# Patient Record
Sex: Female | Born: 1990 | Race: White | Hispanic: No | Marital: Single | Smoking: Never smoker
Health system: Southern US, Community
[De-identification: ages and names within clinical notes are randomized; demographics above are authoritative.]

---
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
## 2012-01-12 HISTORY — PX: KNEE SURGERY: SHX244

## 2012-11-12 ENCOUNTER — Emergency Department (HOSPITAL_BASED_OUTPATIENT_CLINIC_OR_DEPARTMENT_OTHER): Payer: BC Managed Care – PPO

## 2012-11-12 ENCOUNTER — Encounter (HOSPITAL_COMMUNITY): Payer: Self-pay | Admitting: Emergency Medicine

## 2012-11-12 ENCOUNTER — Emergency Department (HOSPITAL_BASED_OUTPATIENT_CLINIC_OR_DEPARTMENT_OTHER)
Admission: EM | Admit: 2012-11-12 | Discharge: 2012-11-12 | Disposition: A | Discharge status: Home / Self Care | Payer: BC Managed Care – PPO | Attending: Emergency Medicine | Admitting: Emergency Medicine

## 2012-11-12 ENCOUNTER — Emergency Department (HOSPITAL_COMMUNITY)
Admission: EM | Admit: 2012-11-12 | Discharge: 2012-11-12 | Disposition: A | Discharge status: Home / Self Care | Payer: BC Managed Care – PPO | Attending: Emergency Medicine | Admitting: Emergency Medicine

## 2012-11-12 DIAGNOSIS — S83419A Sprain of medial collateral ligament of unspecified knee, initial encounter: Secondary | ICD-10-CM | POA: Insufficient documentation

## 2012-11-12 DIAGNOSIS — Y9389 Activity, other specified: Secondary | ICD-10-CM | POA: Insufficient documentation

## 2012-11-12 DIAGNOSIS — R296 Repeated falls: Secondary | ICD-10-CM | POA: Insufficient documentation

## 2012-11-12 DIAGNOSIS — Y9289 Other specified places as the place of occurrence of the external cause: Secondary | ICD-10-CM | POA: Insufficient documentation

## 2012-11-12 DIAGNOSIS — M2392 Unspecified internal derangement of left knee: Secondary | ICD-10-CM

## 2012-11-12 DIAGNOSIS — M25469 Effusion, unspecified knee: Secondary | ICD-10-CM | POA: Insufficient documentation

## 2012-11-12 DIAGNOSIS — M25462 Effusion, left knee: Secondary | ICD-10-CM

## 2012-11-12 DIAGNOSIS — M2390 Unspecified internal derangement of unspecified knee: Secondary | ICD-10-CM | POA: Insufficient documentation

## 2012-11-12 DIAGNOSIS — X500XXA Overexertion from strenuous movement or load, initial encounter: Secondary | ICD-10-CM | POA: Insufficient documentation

## 2012-11-12 DIAGNOSIS — S83412A Sprain of medial collateral ligament of left knee, initial encounter: Secondary | ICD-10-CM

## 2012-11-12 MED ORDER — HYDROCODONE-ACETAMINOPHEN 5-325 MG PO TABS: 1.0000 | ORAL_TABLET | Freq: Four times a day (QID) | ORAL | Status: AC | PRN

## 2012-11-12 NOTE — ED Notes (Signed)
Pt not evaluated by this RN prior to discharge. Mother did not want to wait for discharge instructions

## 2012-11-12 NOTE — ED Provider Notes (Signed)
See downtime documentation.  Nursing notes and vitals signs, including pulse oximetry, reviewed.  Summary of this visit's results, reviewed by myself:  Labs:  No results found for this or any previous visit (from the past 24 hour(s)).  Imaging Studies: Dg Knee Complete 4 Views Left  11/12/2012   CLINICAL DATA:  Pain, twist injury  EXAM: LEFT KNEE - COMPLETE 4+ VIEW  COMPARISON:  None  FINDINGS: Medial compartment joint space narrowing.  Osseous mineralization normal.  No acute fracture, dislocation or bone destruction.  Small knee joint effusion is likely present.  IMPRESSION: Joint space narrowing at medial compartment left knee with suspect small knee joint effusion.   Electronically Signed   By: Ulyses Southward M.D.   On: 11/12/2012 01:47      Hanley Seamen, MD 11/12/12 0150

## 2012-11-12 NOTE — ED Provider Notes (Signed)
CSN: [id number]     Arrival date & time 11/12/12  1447 History  This chart was scribed for non-physician practitioner, Teressa Lower, NP working with Purvis Sheffield, MD by Greggory Stallion, ED scribe. This patient was seen in room WTR6/WTR6 and the patient's care was started at 3:37 PM.   No chief complaint on file.  The history is provided by the patient. No language interpreter was used.   HPI Comments: Sue Sanders is a 22 y.o. female who presents to the Emergency Department complaining of left knee injury that occurred last night while she was at work. She states her knee popped and now has sudden onset pain and swelling. Pt states she went to [facility] last night and was given a knee immobilizer, crutches, and an orthopedic referral. She states her left foot is now cold and is concerned with circulation problems. Pt states she can not straighten her leg. She has no history of left knee issues.   No past medical history on file. No past surgical history on file. No family history on file. History  Substance Use Topics  . Smoking status: Not on file  . Smokeless tobacco: Not on file  . Alcohol Use: Not on file   OB History   No data available     Review of Systems  Musculoskeletal: Positive for arthralgias and joint swelling.  All other systems reviewed and are negative.    Allergies  Review of patient's allergies indicates not on file.  Home Medications   Current Outpatient Rx  Name  Route  Sig  Dispense  Refill  . HYDROcodone-acetaminophen (NORCO/VICODIN) 5-325 MG per tablet   Oral   Take 1-2 tablets by mouth every 6 (six) hours as needed for pain.   20 tablet   0    BP 111/79  Pulse 110  Temp(Src) 99.3 F (37.4 C) (Oral)  Resp 18  SpO2 96%  LMP 10/14/2012  Physical Exam  Nursing note and vitals reviewed. Constitutional: She is oriented to person, place, and time. She appears well-developed and well-nourished. No distress.  HENT:  Head:  Normocephalic and atraumatic.  Eyes: EOM are normal.  Neck: Neck supple. No tracheal deviation present.  Cardiovascular: Normal rate, regular rhythm and normal heart sounds.   Dorsal pedal pulse on left foot intact.   Pulmonary/Chest: Effort normal and breath sounds normal. No respiratory distress. She has no wheezes. She has no rales.  Musculoskeletal: Normal range of motion.  Pt has obvious swelling to the lateral aspect of the left knee:pt has knee slightly flexed but unable to fully flex or extend the knee  Neurological: She is alert and oriented to person, place, and time.  Skin: Skin is warm and dry.  Psychiatric: She has a normal mood and affect. Her behavior is normal.    ED Course  Procedures (including critical care time)  DIAGNOSTIC STUDIES: Oxygen Saturation is 96% on RA, normal by my interpretation.    COORDINATION OF CARE: 3:44 PM-Discussed treatment plan which includes discharge with pt at bedside and pt agreed to plan.   Labs Review Labs Reviewed - No data to display Imaging Review Dg Knee Complete 4 Views Left  11/12/2012   CLINICAL DATA:  Pain, twist injury  EXAM: LEFT KNEE - COMPLETE 4+ VIEW  COMPARISON:  None  FINDINGS: Medial compartment joint space narrowing.  Osseous mineralization normal.  No acute fracture, dislocation or bone destruction.  Small knee joint effusion is likely present.  IMPRESSION: Joint space narrowing at  medial compartment left knee with suspect small knee joint effusion.   Electronically Signed   By: Ulyses Southward M.D.   On: 11/12/2012 01:47    EKG Interpretation   None       MDM   1. Knee effusion, left   2. Internal derangement of knee, left    Family upset because they wanted to see an orthopedist and have an GNF:[id number] to mother that the orthopedist would do that on an outpt basis:family left prior to getting discharge paper    I personally performed the services described in this documentation, which was scribed in my  presence. The recorded information has been reviewed and is accurate.   Teressa Lower, NP 11/12/12 1557

## 2012-11-12 NOTE — ED Notes (Signed)
Pt states that she was standing up this morning and her knee gave out.  C/o lt knee pain since.  [facility]  Pt is upset because [place], NP will not order an MRI.  Deliah Boston explained that MRI's are not done emergently and there is no reason to do one now.  Pt left being upset.

## 2012-11-13 NOTE — ED Provider Notes (Signed)
Medical screening examination/treatment/procedure(s) were performed by non-physician practitioner and as supervising physician I was immediately available for consultation/collaboration.  EKG Interpretation   None         Junius Argyle, MD 11/13/12 1536

## 2013-09-12 ENCOUNTER — Observation Stay: Payer: Self-pay

## 2013-09-12 LAB — CBC WITH DIFFERENTIAL/PLATELET
BASOS ABS: 0.1 10*3/uL (ref 0.0–0.1)
Basophil %: 1 %
EOS PCT: 0.7 %
Eosinophil #: 0.1 10*3/uL (ref 0.0–0.7)
HCT: 37.2 % (ref 35.0–47.0)
HGB: 12.3 g/dL (ref 12.0–16.0)
LYMPHS ABS: 2.2 10*3/uL (ref 1.0–3.6)
Lymphocyte %: 15.5 %
MCH: 29.6 pg (ref 26.0–34.0)
MCHC: 33 g/dL (ref 32.0–36.0)
MCV: 90 fL (ref 80–100)
Monocyte #: 0.7 x10 3/mm (ref 0.2–0.9)
Monocyte %: 4.8 %
NEUTROS PCT: 78 %
Neutrophil #: 10.9 10*3/uL — ABNORMAL HIGH (ref 1.4–6.5)
PLATELETS: 167 10*3/uL (ref 150–440)
RBC: 4.15 10*6/uL (ref 3.80–5.20)
RDW: 13.1 % (ref 11.5–14.5)
WBC: 14 10*3/uL — AB (ref 3.6–11.0)

## 2013-09-12 LAB — URINALYSIS, COMPLETE
Bacteria: NONE SEEN
Bilirubin,UR: NEGATIVE
Blood: NEGATIVE
GLUCOSE, UR: NEGATIVE mg/dL (ref 0–75)
KETONE: NEGATIVE
Leukocyte Esterase: NEGATIVE
Nitrite: NEGATIVE
PROTEIN: NEGATIVE
Ph: 7 (ref 4.5–8.0)
RBC,UR: 2 /HPF (ref 0–5)
Specific Gravity: 1.006 (ref 1.003–1.030)

## 2013-09-12 LAB — COMPREHENSIVE METABOLIC PANEL
ALK PHOS: 56 U/L
Albumin: 2.9 g/dL — ABNORMAL LOW (ref 3.4–5.0)
Anion Gap: 8 (ref 7–16)
BUN: 6 mg/dL — AB (ref 7–18)
Bilirubin,Total: 0.3 mg/dL (ref 0.2–1.0)
CALCIUM: 8.7 mg/dL (ref 8.5–10.1)
Chloride: 107 mmol/L (ref 98–107)
Co2: 25 mmol/L (ref 21–32)
Creatinine: 0.64 mg/dL (ref 0.60–1.30)
EGFR (African American): >60
EGFR (Non-African Amer.): >60
Glucose: 83 mg/dL (ref 65–99)
Osmolality: 276 (ref 275–301)
Potassium: 3.3 mmol/L — ABNORMAL LOW (ref 3.5–5.1)
SGOT(AST): 18 U/L (ref 15–37)
SGPT (ALT): 13 U/L — ABNORMAL LOW
SODIUM: 140 mmol/L (ref 136–145)
TOTAL PROTEIN: 6.2 g/dL — AB (ref 6.4–8.2)

## 2013-09-12 LAB — FETAL FIBRONECTIN
Appearance: NORMAL
FETAL FIBRONECTIN: NEGATIVE

## 2013-09-17 ENCOUNTER — Observation Stay: Payer: Self-pay

## 2013-10-08 ENCOUNTER — Encounter: Payer: Self-pay | Admitting: Obstetrics and Gynecology

## 2013-10-31 ENCOUNTER — Observation Stay: Payer: Self-pay | Admitting: *Deleted

## 2013-10-31 LAB — URINALYSIS, COMPLETE
BILIRUBIN, UR: NEGATIVE
Bacteria: NONE SEEN
Blood: NEGATIVE
GLUCOSE, UR: NEGATIVE mg/dL (ref 0–75)
Ketone: NEGATIVE
Leukocyte Esterase: NEGATIVE
Nitrite: NEGATIVE
Ph: 7 (ref 4.5–8.0)
Protein: NEGATIVE
RBC,UR: <1 /HPF (ref 0–5)
SPECIFIC GRAVITY: 1.003 (ref 1.003–1.030)
Squamous Epithelial: <1

## 2013-11-08 ENCOUNTER — Encounter: Payer: Self-pay | Admitting: Maternal & Fetal Medicine

## 2013-11-15 ENCOUNTER — Encounter: Payer: Self-pay | Admitting: Obstetrics and Gynecology

## 2013-11-22 ENCOUNTER — Inpatient Hospital Stay: Payer: Self-pay | Admitting: Obstetrics & Gynecology

## 2013-11-22 LAB — CBC WITH DIFFERENTIAL/PLATELET
BASOS ABS: 0 10*3/uL (ref 0.0–0.1)
Basophil %: 0.1 %
EOS ABS: 0 10*3/uL (ref 0.0–0.7)
EOS PCT: 0.3 %
HCT: 39 % (ref 35.0–47.0)
HGB: 13 g/dL (ref 12.0–16.0)
Lymphocyte #: 2 10*3/uL (ref 1.0–3.6)
Lymphocyte %: 15 %
MCH: 29.9 pg (ref 26.0–34.0)
MCHC: 33.3 g/dL (ref 32.0–36.0)
MCV: 90 fL (ref 80–100)
MONO ABS: 0.8 x10 3/mm (ref 0.2–0.9)
MONOS PCT: 6.1 %
Neutrophil #: 10.3 10*3/uL — ABNORMAL HIGH (ref 1.4–6.5)
Neutrophil %: 78.5 %
Platelet: 128 10*3/uL — ABNORMAL LOW (ref 150–440)
RBC: 4.33 10*6/uL (ref 3.80–5.20)
RDW: 13.7 % (ref 11.5–14.5)
WBC: 13.1 10*3/uL — AB (ref 3.6–11.0)

## 2013-11-24 LAB — PLATELET COUNT: PLATELETS: 117 10*3/uL — AB (ref 150–440)

## 2013-11-25 LAB — HEMATOCRIT: HCT: 36.4 % (ref 35.0–47.0)

## 2013-12-02 LAB — GC/CHLAMYDIA PROBE AMP

## 2014-05-04 NOTE — Consult Note (Signed)
Referral Information:  Reason for Referral 24 yo gravida 2 para 0010 at 2570w0d by 776w5d US with poor weight gain and suspected fetal growth restriction.   Referring Physician Farrel Connersolleen Gutierrez CNM   Past Obstetrical Hx 02/2012 - TAB at 9 weeks   Home Medications: Medication Instructions Status  Prenatal Multivitamins Prenatal Multivitamins oral tablet 1 tab(s) orally once a day Active   Allergies:   No Known Allergies:   Vital Signs/Notes:  Nursing Vital Signs: **Vital Signs.:   29-Oct-15 13:07  Vital Signs Type Routine  Temperature Temperature (F) 98.3  Celsius 36.8  Temperature Source oral  Pulse Pulse 67  Respirations Respirations 18  Systolic BP Systolic BP 120  Diastolic BP (mmHg) Diastolic BP (mmHg) 75  Mean BP 90  Pulse Ox % Pulse Ox % 99  Pulse Ox Activity Level  At rest  Oxygen Delivery Room Air/ 21 %   Perinatal Consult:  PMed Hx Benign   PSurg Hx Tympanoplasty at age 24; Knee surgery 11/2012   FHx Mother - good health; Father - ?; MGM - HTN, DVT, MGF - HTN   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    The patient was given a note to be out of work for the duration of pregnancy (and six weeks postpartum).   She was scheduled for follow-up NST, AFI and Dopplers here in one week.  [facility].  She was counseled to report decreased fetal movement, leaking of water,  bleeding or labor.  I will try to speak to Farrel Connersolleen Gutierrez CNM before she sees the patient tomorrow about my recommendation for delivery at 37 weeks due to severe growth restriction.    Thank you for allowing us to participate in her care.   Plan:  Antepartum Testing Twice weekly   Delivery at what gestational age 10137    Total Time Spent with Patient 45 minutes   >50% of visit spent in couseling/coordination of care yes   Office Use Only [phone]  Office Visit Level 5 (40min) EST comprehensive office/outpt   Coding Description: FETAL - 2nd/3rd TRIMESTER INDICATION(S).   FGR - Poor Fetal Growth.  Electronic Signatures: Lady DeutscherJames, Lene Mckay (MD)  (Signed 29-Oct-15 15:21)  Authored: Referral, Home Medications, Allergies, Vital Signs/Notes, Consult, Exam, Impression, Plan, Billing, Coding Description   Last Updated: 29-Oct-15 15:21 by Lady DeutscherJames, Emmanuella Mirante (MD)

## 2014-05-21 NOTE — H&P (Signed)
L&D Evaluation:  History:  HPI 23yo G2 P0010 at 37/0 with EDC of 12/13/13 by LMP and c/w 1st trimester US.  Has been followed this pregnancy with <5th%ile symmetric IUGR and was recommended induction at 37 weeks.  O+ /VNI / RNI   Presents with no complaints   Patient's Medical History ADD, depression   Patient's Surgical History none   Medications Pre Natal Vitamins   Allergies NKDA   Social History none   Family History Non-Contributory   ROS:  ROS All systems were reviewed.  HEENT, CNS, GI, GU, Respiratory, CV, Renal and Musculoskeletal systems were found to be normal.   Exam:  Vital Signs stable   Urine Protein not completed   General no apparent distress   Mental Status clear   Chest clear   Heart normal sinus rhythm   Abdomen gravid, non-tender   Estimated Fetal Weight Small for gestational age   Fetal Position cephalic   Pelvic soft, closed, posterior   Mebranes Intact   FHT normal rate with no decels   Fetal Heart Rate 130   Ucx absent   Skin dry, no lesions   Impression:  Impression IUGR for IOL   Plan:  Comments Admit to L&D for induction as scheduled with Cervadil.  Ambien PRN sleep.  Continuous EFM and toco.  Clear liquids.  Saline lock overnight.   Electronic Signatures: Esmae Donathan, Elenora Fenderhelsea C (MD)  (Signed [phone]-[phone] 22:36)  Authored: L&D Evaluation   Last Updated: 12-Nov-15 22:36 by Tarence Searcy, Elenora Fenderhelsea C (MD)

## 2014-05-21 NOTE — H&P (Signed)
L&D Evaluation:  History:  HPI 24 yo G2 P0010 with EDD of 12/13/13 by a 6wk5 day ultrasound presents at 2859w4d for a NST. Was seen in the office on Thursday 9/3 and reported decreased FM. A NST at that time was reactive with 15x15 accels but there were also some variables noted (basline 150 with decels to 100-120 x 20-40 seconds). AFI was normal. Baby has been moving well since then. Has had a little cramping today. Was seen in L&D 6 days ago for ctxs and given a dose of Procardia. FFN was negative and cx was closed. Recheck of cx was clsed/thick/OOp  and baby was transverse on 9/3. Denies LOF, VB.  [facility], [facility] to [facility]. Recurrent UTIs, h/o depression/ADD, and a 9# weight loss early in pregnancy-now starting to gain some weight back. First trimester test and MSAFP  negative. O POS, RNI, VNI   Presents with presents for a NST   Patient's Medical History Depression/ADD   Patient's Surgical History knee surgery   Medications Pre Natal Vitamins  other  Procardia 10 mgm q6 hours prn ctxs   Allergies NKDA   Social History none   Family History Non-Contributory   ROS:  ROS see HPI   Exam:  Vital Signs stable  120/71   Urine Protein not completed   General no apparent distress, appears anxious   Mental Status clear   Abdomen gravid, non-tender   Mebranes Intact   FHT initially when supine baseline was 145 with frequent 10-15 sec variable decels to 120s, then turned to the right side, baseline 145 with accels to 160s-180. with no decels   FHT Description mod variability   Ucx irregular, mild, mostly uterine irritability   Skin dry   Impression:  Impression IUP at 27 4/7 weeks with a reactive NST.   Plan:  Plan DC home. FKCs daily. FU in 3 days in office. Will schedule for a growth scan for poor weight gain this pregnancy.   Comments There were 15x15 accels on her NST  (more than 2 in a 20 minute period). Total monitor time 2 hours   Electronic  Signatures: Trinna BalloonGutierrez, Austen Wygant L (CNM)  (Signed 07-Sep-15 17:52)  Authored: L&D Evaluation   Last Updated: 07-Sep-15 17:52 by Trinna BalloonGutierrez, Deicy Rusk L (CNM)

## 2014-05-21 NOTE — H&P (Signed)
L&D Evaluation:  History Expanded:  HPI 24 yo G2 P0010 with EDD of 12/13/13 presents at 7028w6d from office after c/o frequent low abdominal cramping for several hours. Denies LOF, VB or decreased FM. No NVD or other symptoms. Last intercourse 2 days ago. [facility], [facility] entry to care. Recurrent UTIs.   Blood Type (Maternal) O positive   Group B Strep Results Maternal (Result >5wks must be treated as unknown) unknown/result > 5 weeks ago   Maternal HIV Negative   Maternal Syphilis Ab Nonreactive   Maternal Varicella Non-Immune   Rubella Results (Maternal) nonimmune   Presents with cramping   Patient's Surgical History knee surgery   Medications Pre Natal Vitamins   Allergies NKDA   Social History none   Exam:  Vital Signs stable   General no apparent distress, cheeks flushed - reports feeling nervous on admission   Mental Status clear   Abdomen gravid, mild contractions   Pelvic no external lesions, cervix closed and thick, unchanged on re-check after several hours, wet mount negative   Mebranes Intact   FHT +FHTs, appropriate for gestational age. Elevated FHR baseline after Procardia   Ucx periods  of irritability vs mild frequent contractions   Other FFN negative, UA unremarkable, CBC WBC 14, otherwise normal, CMP: K+ 3.3.   Impression:  Impression IUP at 728w6d, no evidence of Preterm labor   Plan:  Plan discharge   Comments Contractions initially treated with IV fluid bolus and procardia 20 mg po. Per toco and pt report decreased in frequency after this, with periods of irritability. Rx for Procardia to use for frequent contractions prn. If contractions intensify - return to L&D or call office. Continue po fluid intake.   Follow Up Appointment already scheduled   Electronic Signatures: Vella KohlerBrothers, Fahad Cisse K (CNM)  (Signed 03-Sep-15 01:03)  Authored: L&D Evaluation   Last Updated: 03-Sep-15 01:03 by Vella KohlerBrothers, Kynadie Yaun K (CNM)

## 2014-05-21 NOTE — H&P (Signed)
L&D Evaluation:  History:  HPI 24 yo G2 P0010 with EDD of 12/13/13 by a 6wk5 day ultrasound presents at 6233 wk6d with c/o decreased FM and possible LOF. Has been contracting frequently today q5 min but ctxs have not been any more intense then she usually experiences.  Was seen in L&D on 3 Sept  for ctxs and given a dose of Procardia. FFN was negative and cx was closed. She was given a RX for Procardia, but has not taken any because the ctxs are very mild. Cervix has remained closed when checked in office.  Denies VB.  [facility], [facility] to [facility]. Recurrent UTIs, h/o depression/ADD, and a 9# weight loss early in pregnancy-and has gained a net weight of 8#. There has been concern for fetal growth restriction. A DP ultrasound EFW in the 17th% about 2-3 weeks ago. [facility] First trimester test and MSAFP  negative. O POS, RNI, VNI   Presents with decreased fetal movement, leaking fluid   Patient's Medical History Depression/ADD   Patient's Surgical History knee surgery   Medications Pre Natal Vitamins  other  Procardia 10 mgm q6 hours prn ctxs (has not taken)   Allergies NKDA   Social History none   Family History Non-Contributory   ROS:  ROS see HPI   Exam:  Vital Signs 140/91 initially then 126/76, 134/81   Urine Protein negative dipstick   General no apparent distress   Mental Status clear   Abdomen gravid, non-tender   Fetal Position cephalic   Edema no edema   Pelvic no external lesions, white discharge. CX: closed/60%/-3   Mebranes Intact, negative Nitrazine. AFI=9.62cm with 4 pockets >/=2 cm   FHT normal rate with no decels, 135-140 with accels to 150s to 160s.   FHT Description mod variability   Ucx q3-5 min x 40-50 sec, mild to palpation.   Skin dry   Impression:  Impression IUP at 33 6/7 weeks. No evidence of SROM. Reactive NST. Preterm contractions-no cx dilation   Plan:  Plan Regular diet/fluids. Monitor  ctxs and recheck cx in 2 hours.  UA.   Electronic Signatures: Trinna BalloonGutierrez, Raphel Stickles L (CNM)  (Signed 22-Oct-15 08:25)  Authored: L&D Evaluation   Last Updated: 22-Oct-15 08:25 by Trinna BalloonGutierrez, Aalijah Mims L (CNM)

## 2015-06-25 ENCOUNTER — Emergency Department
Admission: EM | Admit: 2015-06-25 | Discharge: 2015-06-25 | Disposition: A | Discharge status: Home / Self Care | Payer: PRIVATE HEALTH INSURANCE | Attending: Emergency Medicine | Admitting: Emergency Medicine

## 2015-06-25 ENCOUNTER — Emergency Department: Payer: PRIVATE HEALTH INSURANCE

## 2015-06-25 ENCOUNTER — Encounter: Payer: Self-pay | Admitting: Radiology

## 2015-06-25 ENCOUNTER — Encounter: Payer: Self-pay | Admitting: *Deleted

## 2015-06-25 ENCOUNTER — Ambulatory Visit
Admission: EM | Admit: 2015-06-25 | Discharge: 2015-06-25 | Disposition: A | Discharge status: Other Acute Inpatient Hospital | Payer: PRIVATE HEALTH INSURANCE | Attending: Family Medicine | Admitting: Family Medicine

## 2015-06-25 DIAGNOSIS — R102 Pelvic and perineal pain: Secondary | ICD-10-CM | POA: Diagnosis not present

## 2015-06-25 DIAGNOSIS — O26891 Other specified pregnancy related conditions, first trimester: Secondary | ICD-10-CM | POA: Diagnosis not present

## 2015-06-25 DIAGNOSIS — Z3A01 Less than 8 weeks gestation of pregnancy: Secondary | ICD-10-CM | POA: Insufficient documentation

## 2015-06-25 DIAGNOSIS — R109 Unspecified abdominal pain: Secondary | ICD-10-CM

## 2015-06-25 LAB — URINALYSIS COMPLETE WITH MICROSCOPIC (ARMC ONLY)
BACTERIA UA: NONE SEEN
Bilirubin Urine: NEGATIVE
Glucose, UA: NEGATIVE mg/dL
HGB URINE DIPSTICK: NEGATIVE
KETONES UR: NEGATIVE mg/dL
LEUKOCYTES UA: NEGATIVE
NITRITE: NEGATIVE
PH: 8 (ref 5.0–8.0)
PROTEIN: NEGATIVE mg/dL
Specific Gravity, Urine: 1.008 (ref 1.005–1.030)

## 2015-06-25 LAB — COMPREHENSIVE METABOLIC PANEL
ALT: 27 U/L (ref 14–54)
AST: 23 U/L (ref 15–41)
Albumin: 4.9 g/dL (ref 3.5–5.0)
Alkaline Phosphatase: 47 U/L (ref 38–126)
Anion gap: 9 (ref 5–15)
BUN: 8 mg/dL (ref 6–20)
CHLORIDE: 105 mmol/L (ref 101–111)
CO2: 24 mmol/L (ref 22–32)
Calcium: 10.1 mg/dL (ref 8.9–10.3)
Creatinine, Ser: 0.81 mg/dL (ref 0.44–1.00)
Glucose, Bld: 101 mg/dL — ABNORMAL HIGH (ref 65–99)
POTASSIUM: 3.7 mmol/L (ref 3.5–5.1)
Sodium: 138 mmol/L (ref 135–145)
TOTAL PROTEIN: 7.5 g/dL (ref 6.5–8.1)
Total Bilirubin: 0.3 mg/dL (ref 0.3–1.2)

## 2015-06-25 LAB — CBC
HEMATOCRIT: 41.5 % (ref 35.0–47.0)
Hemoglobin: 14.1 g/dL (ref 12.0–16.0)
MCH: 29.9 pg (ref 26.0–34.0)
MCHC: 33.9 g/dL (ref 32.0–36.0)
MCV: 88 fL (ref 80.0–100.0)
Platelets: 201 10*3/uL (ref 150–440)
RBC: 4.72 MIL/uL (ref 3.80–5.20)
RDW: 12.9 % (ref 11.5–14.5)
WBC: 10.7 10*3/uL (ref 3.6–11.0)

## 2015-06-25 LAB — HCG, QUANTITATIVE, PREGNANCY: hCG, Beta Chain, Quant, S: 9635 m[IU]/mL — ABNORMAL HIGH (ref <5)

## 2015-06-25 LAB — POCT PREGNANCY, URINE: Preg Test, Ur: POSITIVE — AB

## 2015-06-25 LAB — LIPASE, BLOOD: Lipase: 19 U/L (ref 11–51)

## 2015-06-25 MED ORDER — ACETAMINOPHEN 500 MG PO TABS
ORAL_TABLET | ORAL | Status: AC
  Filled 2015-06-25: qty 2

## 2015-06-25 MED ORDER — ACETAMINOPHEN 500 MG PO TABS
1000.0000 mg | ORAL_TABLET | ORAL | Status: AC
  Administered 2015-06-25: 1000 mg via ORAL

## 2015-06-25 NOTE — ED Provider Notes (Addendum)
[facility] Emergency Department Provider Note  ____________________________________________  Time seen: Approximately 4:48 PM  I have reviewed the triage vital signs and the nursing notes.   HISTORY  Chief Complaint Abdominal Pain    HPI Sue Sanders is a 25 y.o. female presents for evaluation of sharp discomfort in the right lower pelvis getting somewhat worse since this morning.  Patient reports that she is pregnant. Her last menstrual period started approximately Mayseventh, estimating she is about 5 weeks, 2 days pregnant.   Patient denies any nausea and vomiting. No fever, reports she had a temp of 99.4. She has not had any radiating pain. She reports a sharp discomfort located primarily in the right lower pelvis. No chest pain or trouble breathing. She's had 1 previous pregnancy, no complication. She is not any fertilization or pregnancy hormone treatment.  She is O+, she presents her blood bank card, which notes her blood type.   History reviewed. No pertinent past medical history.  There are no active problems to display for this patient.   Past Surgical History  Procedure Laterality Date  . Knee surgery Right 2014    Current Outpatient Rx  Name  Route  Sig  Dispense  Refill  . HYDROcodone-acetaminophen (NORCO/VICODIN) 5-325 MG per tablet   Oral   Take 1-2 tablets by mouth every 6 (six) hours as needed for pain.   20 tablet   0   . sertraline (ZOLOFT) 50 MG tablet   Oral   Take 50 mg by mouth at bedtime.           Allergies Review of patient's allergies indicates no known allergies.  History reviewed. No pertinent family history.  Social History Social History  Substance Use Topics  . Smoking status: Never Smoker   . Smokeless tobacco: None  . Alcohol Use: 0.0 oz/week    0 Standard drinks or equivalent per week    Review of Systems Constitutional: No fever/chills. Eyes: No visual changes. ENT: No sore  throat. Cardiovascular: Denies chest pain. Respiratory: Denies shortness of breath. Gastrointestinal: No nausea, no vomiting.  No diarrhea.  No constipation. Genitourinary: Negative for dysuria. Musculoskeletal: Negative for back pain. Skin: Negative for rash. Neurological: Negative for headaches, focal weakness or numbness.  10-point ROS otherwise negative.  ____________________________________________   PHYSICAL EXAM:  VITAL SIGNS: ED Triage Vitals  Enc Vitals Group     BP 06/25/15 1618 137/84 mmHg     Pulse Rate 06/25/15 1618 87     Resp 06/25/15 1618 18     Temp 06/25/15 1618 99.5 F (37.5 C)     Temp Source 06/25/15 1618 Oral     SpO2 06/25/15 1618 100 %     Weight 06/25/15 1618 165 lb (74.844 kg)     Height 06/25/15 1618 5\' 9"  (1.753 m)     Head Cir --      Peak Flow --      Pain Score 06/25/15 1618 5     Pain Loc --      Pain Edu? --      Excl. in GC? --    Constitutional: Alert and oriented. Well appearing and in no acute distress. Eyes: Conjunctivae are normal. PERRL. EOMI. Head: Atraumatic. Nose: No congestion/rhinnorhea. Mouth/Throat: Mucous membranes are moist.  Oropharynx non-erythematous. Neck: No stridor.   Cardiovascular: Normal rate, regular rhythm. Grossly normal heart sounds.  Good peripheral circulation. Respiratory: Normal respiratory effort.  No retractions. Lungs CTAB. Gastrointestinal: Soft and nontender except for  moderate discomfort focally in the right lower abdomen. Positive Ros[place]. No distention. No abdominal bruits. No CVA tenderness. Musculoskeletal: No lower extremity tenderness nor edema.  No joint effusions. Neurologic:  Normal speech and language. No gross focal neurologic deficits are appreciated. Skin:  Skin is warm, dry and intact. No rash noted. Psychiatric: Mood and affect are normal. Speech and behavior are normal.  ____________________________________________   LABS (all labs ordered are listed, but only abnormal results  are displayed)  Labs Reviewed  COMPREHENSIVE METABOLIC PANEL - Abnormal; Notable for the following:    Glucose, Bld 101 (*)    All other components within normal limits  URINALYSIS COMPLETEWITH MICROSCOPIC ([facility] ONLY) - Abnormal; Notable for the following:    Color, Urine STRAW (*)    APPearance CLEAR (*)    Squamous Epithelial / LPF 0-5 (*)    All other components within normal limits  HCG, QUANTITATIVE, PREGNANCY - Abnormal; Notable for the following:    hCG, Beta Chain, Quant, S 9635 (*)    All other components within normal limits  POCT PREGNANCY, URINE - Abnormal; Notable for the following:    Preg Test, Ur POSITIVE (*)    All other components within normal limits  LIPASE, BLOOD  CBC  POC URINE PREG, ED   ____________________________________________  EKG   ____________________________________________  RADIOLOGY  US OB Transvaginal (Final result) Result time: 06/25/15 18:04:08   Final result by Rad Results In Interface (06/25/15 18:04:08)   Narrative:   CLINICAL DATA: 25 year old pregnant female with sharp right pelvic pain since this morning. Pending quantitative beta HCG.  EDC by LMP: 02/22/2016, projecting to an expected gestational age of [redacted] weeks 3 days.  EXAM: OBSTETRIC <14 WK US AND TRANSVAGINAL OB  US DOPPLER ULTRASOUND OF OVARIES  TECHNIQUE: Both transabdominal and transvaginal ultrasound examinations were performed for complete evaluation of the gestation as well as the maternal uterus, adnexal regions, and pelvic cul-de-sac. Transvaginal technique was performed to assess early pregnancy.  Color and duplex Doppler ultrasound was utilized to evaluate blood flow to the ovaries.  COMPARISON: No prior scans from this gestation.  FINDINGS: Intrauterine gestational sac: Single intrauterine gestational sac appears normal in shape and position.  Yolk sac: Present.  Embryo: Not visualized.  Embryonic Cardiac Activity: Not visualized.  MSD:  6.9 mm  5 w  3 d     US EDC: 02/22/2016  Subchorionic hemorrhage: None visualized.  Maternal uterus/adnexae: Anteverted anteflexed uterus. Heterogeneity of the anterior uterine myometrium, with no definite fibroids.  Right ovary measures 3.2 x 1.9 x 2.7 cm. Left ovary measures 2.5 x 1.4 x 1.6 cm. No suspicious ovarian or adnexal masses. No abnormal free fluid in the pelvis.  Pulsed Doppler evaluation of both ovaries demonstrates normal appearing low-resistance arterial and venous waveforms.  IMPRESSION: 1. Single intrauterine gestational sac with yolk sac at 5 weeks 3 days by mean sac diameter, concordant with provided menstrual dating. Embryo not visualized, which could be due to early gestational age. A follow-up obstetric scan is recommended in 11-14 days. 2. No evidence of adnexal torsion. No ovarian or adnexal abnormalities.   Electronically Signed By: Delbert PhenixJason A Poff M.D. On: 06/25/2015 18:04          US Art/Ven Flow Abd Pelv Doppler (Final result) Result time: 06/25/15 18:04:08   Final result by Rad Results In Interface (06/25/15 18:04:08)   Narrative:   CLINICAL DATA: 25 year old pregnant female with sharp right pelvic pain since this morning. Pending quantitative beta HCG.  EDC by  LMP: 02/22/2016, projecting to an expected gestational age of [redacted] weeks 3 days.  EXAM: OBSTETRIC <14 WK [place] AND TRANSVAGINAL OB  US DOPPLER ULTRASOUND OF OVARIES  TECHNIQUE: Both transabdominal and transvaginal ultrasound examinations were performed for complete evaluation of the gestation as well as the maternal uterus, adnexal regions, and pelvic cul-de-sac. Transvaginal technique was performed to assess early pregnancy.  Color and duplex Doppler ultrasound was utilized to evaluate blood flow to the ovaries.  COMPARISON: No prior scans from this gestation.  FINDINGS: Intrauterine gestational sac: Single intrauterine gestational sac appears normal in shape  and position.  Yolk sac: Present.  Embryo: Not visualized.  Embryonic Cardiac Activity: Not visualized.  MSD: 6.9 mm  5 w  3 d     [place] EDC: 02/22/2016  Subchorionic hemorrhage: None visualized.  Maternal uterus/adnexae: Anteverted anteflexed uterus. Heterogeneity of the anterior uterine myometrium, with no definite fibroids.  Right ovary measures 3.2 x 1.9 x 2.7 cm. Left ovary measures 2.5 x 1.4 x 1.6 cm. No suspicious ovarian or adnexal masses. No abnormal free fluid in the pelvis.  Pulsed Doppler evaluation of both ovaries demonstrates normal appearing low-resistance arterial and venous waveforms.  IMPRESSION: 1. Single intrauterine gestational sac with yolk sac at 5 weeks 3 days by mean sac diameter, concordant with provided menstrual dating. Embryo not visualized, which could be due to early gestational age. A follow-up obstetric scan is recommended in 11-14 days. 2. No evidence of adnexal torsion. No ovarian or adnexal abnormalities.   Electronically Signed By: Delbert Phenix M.D. On: 06/25/2015 18:04          US OB Comp Less 14 Wks (Final result) Result time: 06/25/15 18:04:08   Procedure changed from US OB Limited      Final result by Rad Results In Interface (06/25/15 18:04:08)   Narrative:   CLINICAL DATA: 25 year old pregnant female with sharp right pelvic pain since this morning. Pending quantitative beta HCG.  EDC by LMP: 02/22/2016, projecting to an expected gestational age of [redacted] weeks 3 days.  EXAM: OBSTETRIC <14 WK [place] AND TRANSVAGINAL OB  US DOPPLER ULTRASOUND OF OVARIES  TECHNIQUE: Both transabdominal and transvaginal ultrasound examinations were performed for complete evaluation of the gestation as well as the maternal uterus, adnexal regions, and pelvic cul-de-sac. Transvaginal technique was performed to assess early pregnancy.  Color and duplex Doppler ultrasound was utilized to evaluate blood flow to the  ovaries.  COMPARISON: No prior scans from this gestation.  FINDINGS: Intrauterine gestational sac: Single intrauterine gestational sac appears normal in shape and position.  Yolk sac: Present.  Embryo: Not visualized.  Embryonic Cardiac Activity: Not visualized.  MSD: 6.9 mm  5 w  3 d     [place] EDC: 02/22/2016  Subchorionic hemorrhage: None visualized.  Maternal uterus/adnexae: Anteverted anteflexed uterus. Heterogeneity of the anterior uterine myometrium, with no definite fibroids.  Right ovary measures 3.2 x 1.9 x 2.7 cm. Left ovary measures 2.5 x 1.4 x 1.6 cm. No suspicious ovarian or adnexal masses. No abnormal free fluid in the pelvis.  Pulsed Doppler evaluation of both ovaries demonstrates normal appearing low-resistance arterial and venous waveforms.  IMPRESSION: 1. Single intrauterine gestational sac with yolk sac at 5 weeks 3 days by mean sac diameter, concordant with provided menstrual dating. Embryo not visualized, which could be due to early gestational age. A follow-up obstetric scan is recommended in 11-14 days. 2. No evidence of adnexal torsion. No ovarian or adnexal abnormalities.   Electronically Signed By: Delbert Phenix  M.D. On: 06/25/2015 18:04    ____________________________________________   PROCEDURES  Procedure(s) performed: None  Critical Care performed: No  ____________________________________________   INITIAL IMPRESSION / ASSESSMENT AND PLAN / ED COURSE  Pertinent labs & imaging results that were available during my care of the patient were reviewed by me and considered in my medical decision making (see chart for details).  Patient is a positive pregnancy test with increasing pain and focality in the right lower abdomen/pelvis. Differential diagnosis includes immediate exclusion for ectopic. She is hemodynamically stable, but has positive pregnancy. In addition, consider ovarian torsion, appendicitis, or other acute  intra-abdominal process.  She is stable, and we will proceed with labs and ultrasound to evaluate for ovarian process and/or ectopic at this time. We will base her remaining evaluation upon these results.   ----------------------------------------- 7:10 PM on 06/25/2015 -----------------------------------------  Ultrasound demonstrates positive intrauterine gestation, no evidence of torsion or adnexal process. Discussed the case with Dr. Elesa Massed of the obstetric service. I also discussed with the patient, admission for observation for abdominal symptoms, fever etc.) serial exams persist discharge. Patient has opted for admission, and both Dr. Elesa Massed and I think this is reasonable. She will admitted to the obstetrical service under Dr. Elesa Massed. We have ordered an MRI of the abdomen and pelvis, to further assist in characterizing cause of pain however MRI does have somewhat low sensitivity for appendicitis, and thus a negative study cannot exclude it.  ----------------------------------------- 9:37 PM on 06/25/2015 -----------------------------------------  MRI resulted. Patient was to be admitted, however to Dr. Elesa Massed has seen and evaluated the patient and advises discharge. Patient will be discharged with the plan to follow-up on Friday with Dr. Elesa Massed. Careful abdominal pain return precautions given. Given her normal white count, improvement in pain, and reassuring MRI feel there is little benefit observation at this time as an inpatient. The patient will be discharged home, but again careful return precautions are given. ____________________________________________   FINAL CLINICAL IMPRESSION(S) / ED DIAGNOSES  Final diagnoses:  Right sided abdominal pain  Right sided abdominal pain      Sharyn Creamer, MD 06/25/15 1911  Sharyn Creamer, MD 06/25/15 2138

## 2015-06-25 NOTE — ED Provider Notes (Signed)
CSN: [id number]     Arrival date & time 06/25/15  1523 History   First MD Initiated Contact with Patient 06/25/15 1542     Chief Complaint  Patient presents with  . Possible Pregnancy   (Consider location/radiation/quality/duration/timing/severity/associated sxs/prior Treatment) HPI: The patient presents today with symptoms of right-sided abdominal/pelvic pain. Patient states that her symptoms started this morning at work. She had no symptoms when she woke up this morning. She does admit to having some nausea but no vomiting. She denies any measured fever. Patient states that she took up pregnancy tests at home yesterday which was positive. She admits to still having her appendix. She has had some discomfort with urinating. She denies any vaginal discharge or bleeding. She states that her last menstrual period was 05/18/2015.  History reviewed. No pertinent past medical history. Past Surgical History  Procedure Laterality Date  . Knee surgery Right 2014   History reviewed. No pertinent family history. Social History  Substance Use Topics  . Smoking status: Never Smoker   . Smokeless tobacco: None  . Alcohol Use: 0.0 oz/week    0 Standard drinks or equivalent per week   OB History    No data available     Review of Systems: Negative except mentioned above.   Allergies  Review of patient's allergies indicates no known allergies.  Home Medications   Prior to Admission medications   Medication Sig Start Date End Date Taking? Authorizing Provider  HYDROcodone-acetaminophen (NORCO/VICODIN) 5-325 MG per tablet Take 1-2 tablets by mouth every 6 (six) hours as needed for pain. 11/12/12   John Molpus, MD  sertraline (ZOLOFT) 50 MG tablet Take 50 mg by mouth at bedtime.    Historical Provider, MD   Meds Ordered and Administered this Visit  Medications - No data to display  BP 146/63 mmHg  Pulse 71  Temp(Src) 99.4 F (37.4 C) (Oral)  Resp 17  SpO2 100%  LMP 05/18/2015 (Exact  Date) No data found.   Physical Exam   GENERAL: NAD HEENT: no pharyngeal erythema, no exudate, no erythema of TMs, no cervical LAD RESP: CTA B CARD: RRR ABD: +BS, mild right lower quadrant tenderness, mild suprapubic tenderness, +rebound, no guarding, no flank tenderness   NEURO: CN II-XII grossly intact   ED Course  Procedures (including critical care time)  Labs Review Labs Reviewed - No data to display  Imaging Review No results found.     MDM  A/P: Right sided abdominal/pelvic pain with positive urine pregnancy test at home-I would recommend patient at this time to go to the ER for further evaluation and treatment. The nurse has called the ER and informed the ER that the patient will be coming. Patient states that she is capable and up to go to the ER on her own and declines EMS. Vitals are stable. Patient will need to be evaluated for ectopic pregnancy, appendicitis, ovarian torsion, UTI, etc    Jolene ProvostKirtida Sayde Lish, MD 06/25/15 1601

## 2015-06-25 NOTE — ED Notes (Signed)
States she had a positive pregnancy test at home yesterday and today had severe abd cramps, mainly RLQ, states clear discharge but denies any bleeding, states she could be about 3 weeks pregnent

## 2015-06-25 NOTE — ED Notes (Signed)
POCT RESULTS WERE ** POSITIVE** 

## 2015-06-25 NOTE — ED Notes (Signed)
Pt transported to MRI 

## 2015-06-25 NOTE — Discharge Instructions (Signed)
You were seen in the emergency room for abdominal pain. It is important that you follow up closely with Dr. Elesa MassedWard in the next couple of days.  If you're unable to see Dr. Elesa MassedWard you may return to the emergency room or go to the [facility] walk-in clinic in 1 or 2 days for reexam.  Please return to the emergency room right away if you are to develop vaginal bleeding, a fever, severe nausea, your pain becomes severe or worsens, you are unable to keep food down, begin vomiting any dark or bloody fluid, you develop any dark or bloody stools, feel dehydrated, or other new concerns or symptoms arise.

## 2015-06-25 NOTE — ED Notes (Signed)
Patient states that she has been having severe right sided abdominal cramps. Patient states that worsens after she urinates. Patient states that she has been having nausea. Patient states that she took a pregnancy test yesterday and it was positive.

## 2015-06-25 NOTE — ED Notes (Signed)
Report called to charge nurse Tammy SoursGreg at [facility] ED.

## 2015-07-04 NOTE — Consult Note (Addendum)
Consult History and Physical   SERVICE: Gynecology   Patient Name: Sue Sanders Patient MRN:   [id number]  CC: abdominal pain  HPI: Sue Sanders is a 25 y.o. G2P1001 at 5wks 2 days by both LMP and ultrasound performed today (see below).  She had sudden onset RLQ pain, sharp, intermittent, worsening since this morning.  She took a pregnancy test a week ago, and it was positive.  She denies bleeding, nausea/vomiting.  She is concerned about ectopic.   Review of Systems: positives in bold GEN:   fevers, chills, weight changes, appetite changes, fatigue, night sweats HEENT:  HA, vision changes, hearing loss, congestion, rhinorrhea, sinus pressure, dysphagia CV:   CP, palpitations PULM:  SOB, cough GI:  abd pain, N/V/D/C GU:  dysuria, urgency, frequency MSK:  arthralgias, myalgias, back pain, swelling SKIN:  rashes, color changes, pallor NEURO:  numbness, weakness, tingling, seizures, dizziness, tremors PSYCH:  depression, anxiety, behavioral problems, confusion  HEME/LYMPH:  easy bruising or bleeding ENDO:  heat/cold intolerance  Past Obstetrical History: OB History    Gravida Para Term Preterm AB TAB SAB Ectopic Multiple Living   Past Gynecologic History: Patient's last menstrual period was 05/18/2015 (exact date).   Past Medical History: History reviewed. No pertinent past medical history.  Past Surgical History:   Past Surgical History  Procedure Laterality Date  . Knee surgery Right 2014    Family History:  Non-contributory  Social History:  Social History   Social History  . Marital Status: N/A    Spouse Name: N/A  . Number of Children: N/A  . Years of Education: N/A   Occupational History  . Not on file.   Social History Main Topics  . Smoking status: Never Smoker   . Smokeless tobacco: Not on file  . Alcohol Use: 0.0 oz/week    0 Standard drinks or equivalent per week  . Drug Use: No  . Sexual Activity: Not on file    Other Topics Concern  . Not on file   Social History Narrative    Home Medications:  Medications reconciled in EPIC  No current facility-administered medications on file prior to encounter.   Current Outpatient Prescriptions on File Prior to Encounter  Medication Sig Dispense Refill  . HYDROcodone-acetaminophen (NORCO/VICODIN) 5-325 MG per tablet Take 1-2 tablets by mouth every 6 (six) hours as needed for pain. 20 tablet 0  . sertraline (ZOLOFT) 50 MG tablet Take 50 mg by mouth at bedtime.      Allergies:  No Known Allergies  Physical Exam:   99.5, 137/84, 87, 18, 100% room air   General Appearance:  Well developed, well nourished, no acute distress, alert and oriented x3 HEENT:  Normocephalic atraumatic, extraocular movements intact, moist mucous membranes Cardiovascular:  Normal S1/S2, regular rate and rhythm, no murmurs Pulmonary:  clear to auscultation, no wheezes, rales or rhonchi, symmetric air entry, good air exchange Abdomen:  Bowel sounds present, soft, nontender, nondistended, no abnormal masses, no epigastric pain Extremities:  Full range of motion, no pedal edema, 2+ distal pulses, no tenderness Skin:  normal coloration and turgor, no rashes, no suspicious skin lesions noted  Neurologic:  Cranial nerves 2-12 grossly intact, normal muscle tone, strength 5/5 all four extremities Psychiatric:  Normal mood and affect, appropriate, no AH/VH Pelvic:  NEFG, no vulvar masses or lesions, normal vaginal mucosa, no vaginal bleeding or discharge, mobile nontender uterus, no adnexal masses  appreciated, pain not elicited on exam.  Labs/Studies:   CBC and Coags:  Lab Results  Component Value Date   WBC 10.7 06/25/2015   NEUTOPHILPCT 78.5 11/22/2013   EOSPCT 0.3 11/22/2013   BASOPCT 0.1 11/22/2013   LYMPHOPCT 15.0 11/22/2013   HGB 14.1 06/25/2015   HCT 41.5 06/25/2015   MCV 88.0 06/25/2015   PLT 201 06/25/2015   CMP:  Lab Results  Component Value Date   NA 138  06/25/2015   K 3.7 06/25/2015   CL 105 06/25/2015   CO2 24 06/25/2015   BUN 8 06/25/2015   CREATININE 0.81 06/25/2015   CREATININE 0.64 09/12/2013   PROT 7.5 06/25/2015   BILITOT 0.3 06/25/2015   ALT 27 06/25/2015   AST 23 06/25/2015   ALKPHOS 47 06/25/2015     Other Imaging: Mr Pelvis Wo Contrast  06/25/2015  CLINICAL DATA:  25 year old pregnant female with sharp right abdominopelvic pain since this morning. Intrauterine gestation with yolk sac and no embryo visualized on sonogram from earlier today. No ovarian or adnexal abnormalities on sonogram from earlier today. EXAM: MRI ABDOMEN AND PELVIS WITHOUT CONTRAST TECHNIQUE: Multiplanar multisequence MR imaging of the abdomen and pelvis was performed. No intravenous contrast was administered. COMPARISON:  06/25/2015 obstetric sonogram. FINDINGS: MRI ABDOMEN FINDINGS Lower chest: Clear lung bases. Hepatobiliary: Visualized liver is normal in size and configuration, noting exclusion of a portion of the liver dome from the study. No liver mass. Normal gallbladder with no cholelithiasis. No biliary ductal dilatation. Common bile duct diameter 3 mm. No choledocholithiasis. No evidence of a biliary or ampullary mass. Pancreas: No pancreatic mass or duct dilation.  No pancreas divisum. Spleen: Normal size. No mass. Adrenals/Urinary Tract: Normal adrenals. No hydronephrosis. Normal kidneys with no renal mass. Stomach/Bowel: Grossly normal stomach. Small and large bowel are normal caliber, with no bowel wall thickening. Normal appendix is identified (series 6/image 37). Vascular/Lymphatic: Normal caliber abdominal aorta. No pathologically enlarged lymph nodes in the abdomen. Other: No abdominal ascites or focal fluid collection. Musculoskeletal: No aggressive appearing focal osseous lesions. MRI PELVIS FINDINGS Uterus: The anteverted uterus measures 8.2 x 4.6 x 6.5 cm. No uterine fibroids. Intrauterine gestational sac measures 0.9 x 0.8 x 0.5 cm and appears  normal in shape and position. No discrete embryo within the gestational sac. Inner myometrium (junctional zone) measures 4 mm in thickness, which is within normal limits. Endometrium measures 15 mm in bilayer thickness. No evidence of a perigestational bleed. Ovaries and Adnexa: The right ovary measures 3.3 x 1.9 x 2.7 cm and is normal. The left ovary measures 2.6 x 1 point x 3.0 cm and is normal. There are no suspicious ovarian or adnexal masses. Bladder: Normal.  Normal visualized urethra. Bowel: Visualized small and large bowel are normal caliber with no bowel wall thickening. Vascular/Lymphatic: No pathologically enlarged lymph nodes in the pelvis. Other: No abnormal free fluid in the pelvis. No focal pelvic fluid collection. Musculoskeletal: No aggressive appearing focal osseous lesions. IMPRESSION: 1. No acute abnormality in the abdomen. Normal appendix. No cholelithiasis. No biliary ductal dilatation. 2. No acute abnormality in the pelvis. Normal ovaries. No adnexal masses. No fluid collections. 3. Intrauterine gestational sac with no discrete embryo. No evidence of a perigestational bleed. Electronically Signed   By: Delbert Phenix M.D.   On: 06/25/2015 21:06   Mr Abdomen Wo Contrast  06/25/2015  CLINICAL DATA:  25 year old pregnant female with sharp right abdominopelvic pain since this morning. Intrauterine gestation with yolk sac and no embryo visualized  on sonogram from earlier today. No ovarian or adnexal abnormalities on sonogram from earlier today. EXAM: MRI ABDOMEN AND PELVIS WITHOUT CONTRAST TECHNIQUE: Multiplanar multisequence MR imaging of the abdomen and pelvis was performed. No intravenous contrast was administered. COMPARISON:  06/25/2015 obstetric sonogram. FINDINGS: MRI ABDOMEN FINDINGS Lower chest: Clear lung bases. Hepatobiliary: Visualized liver is normal in size and configuration, noting exclusion of a portion of the liver dome from the study. No liver mass. Normal gallbladder with no  cholelithiasis. No biliary ductal dilatation. Common bile duct diameter 3 mm. No choledocholithiasis. No evidence of a biliary or ampullary mass. Pancreas: No pancreatic mass or duct dilation.  No pancreas divisum. Spleen: Normal size. No mass. Adrenals/Urinary Tract: Normal adrenals. No hydronephrosis. Normal kidneys with no renal mass. Stomach/Bowel: Grossly normal stomach. Small and large bowel are normal caliber, with no bowel wall thickening. Normal appendix is identified (series 6/image 37). Vascular/Lymphatic: Normal caliber abdominal aorta. No pathologically enlarged lymph nodes in the abdomen. Other: No abdominal ascites or focal fluid collection. Musculoskeletal: No aggressive appearing focal osseous lesions. MRI PELVIS FINDINGS Uterus: The anteverted uterus measures 8.2 x 4.6 x 6.5 cm. No uterine fibroids. Intrauterine gestational sac measures 0.9 x 0.8 x 0.5 cm and appears normal in shape and position. No discrete embryo within the gestational sac. Inner myometrium (junctional zone) measures 4 mm in thickness, which is within normal limits. Endometrium measures 15 mm in bilayer thickness. No evidence of a perigestational bleed. Ovaries and Adnexa: The right ovary measures 3.3 x 1.9 x 2.7 cm and is normal. The left ovary measures 2.6 x 1 point x 3.0 cm and is normal. There are no suspicious ovarian or adnexal masses. Bladder: Normal.  Normal visualized urethra. Bowel: Visualized small and large bowel are normal caliber with no bowel wall thickening. Vascular/Lymphatic: No pathologically enlarged lymph nodes in the pelvis. Other: No abnormal free fluid in the pelvis. No focal pelvic fluid collection. Musculoskeletal: No aggressive appearing focal osseous lesions. IMPRESSION: 1. No acute abnormality in the abdomen. Normal appendix. No cholelithiasis. No biliary ductal dilatation. 2. No acute abnormality in the pelvis. Normal ovaries. No adnexal masses. No fluid collections. 3. Intrauterine gestational sac  with no discrete embryo. No evidence of a perigestational bleed. Electronically Signed   By: Delbert PhenixJason A Poff M.D.   On: 06/25/2015 21:06   [place] Ob Comp Less 14 Wks  06/25/2015  CLINICAL DATA:  25 year old pregnant female with sharp right pelvic pain since this morning. Pending quantitative beta HCG. EDC by LMP: 02/22/2016, projecting to an expected gestational age of [redacted] weeks 3 days. EXAM: OBSTETRIC <14 WK US AND TRANSVAGINAL OB US DOPPLER ULTRASOUND OF OVARIES TECHNIQUE: Both transabdominal and transvaginal ultrasound examinations were performed for complete evaluation of the gestation as well as the maternal uterus, adnexal regions, and pelvic cul-de-sac. Transvaginal technique was performed to assess early pregnancy. Color and duplex Doppler ultrasound was utilized to evaluate blood flow to the ovaries. COMPARISON:  No prior scans from this gestation. FINDINGS: Intrauterine gestational sac: Single intrauterine gestational sac appears normal in shape and position. Yolk sac:  Present. Embryo:  Not visualized. Embryonic Cardiac Activity: Not visualized. MSD: 6.9  mm   5 w   3  d         US EDC: 02/22/2016 Subchorionic hemorrhage:  None visualized. Maternal uterus/adnexae: Anteverted anteflexed uterus. Heterogeneity of the anterior uterine myometrium, with no definite fibroids. Right ovary measures 3.2 x 1.9 x 2.7 cm. Left ovary measures 2.5 x 1.4 x 1.6 cm. No  suspicious ovarian or adnexal masses. No abnormal free fluid in the pelvis. Pulsed Doppler evaluation of both ovaries demonstrates normal appearing low-resistance arterial and venous waveforms. IMPRESSION: 1. Single intrauterine gestational sac with yolk sac at 5 weeks 3 days by mean sac diameter, concordant with provided menstrual dating. Embryo not visualized, which could be due to early gestational age. A follow-up obstetric scan is recommended in 11-14 days. 2. No evidence of adnexal torsion. No ovarian or adnexal abnormalities. Electronically Signed   By:  Delbert PhenixJason A Poff M.D.   On: 06/25/2015 18:04   [place] Ob Transvaginal  06/25/2015  CLINICAL DATA:  25 year old pregnant female with sharp right pelvic pain since this morning. Pending quantitative beta HCG. EDC by LMP: 02/22/2016, projecting to an expected gestational age of [redacted] weeks 3 days. EXAM: OBSTETRIC <14 WK US AND TRANSVAGINAL OB US DOPPLER ULTRASOUND OF OVARIES TECHNIQUE: Both transabdominal and transvaginal ultrasound examinations were performed for complete evaluation of the gestation as well as the maternal uterus, adnexal regions, and pelvic cul-de-sac. Transvaginal technique was performed to assess early pregnancy. Color and duplex Doppler ultrasound was utilized to evaluate blood flow to the ovaries. COMPARISON:  No prior scans from this gestation. FINDINGS: Intrauterine gestational sac: Single intrauterine gestational sac appears normal in shape and position. Yolk sac:  Present. Embryo:  Not visualized. Embryonic Cardiac Activity: Not visualized. MSD: 6.9  mm   5 w   3  d         US EDC: 02/22/2016 Subchorionic hemorrhage:  None visualized. Maternal uterus/adnexae: Anteverted anteflexed uterus. Heterogeneity of the anterior uterine myometrium, with no definite fibroids. Right ovary measures 3.2 x 1.9 x 2.7 cm. Left ovary measures 2.5 x 1.4 x 1.6 cm. No suspicious ovarian or adnexal masses. No abnormal free fluid in the pelvis. Pulsed Doppler evaluation of both ovaries demonstrates normal appearing low-resistance arterial and venous waveforms. IMPRESSION: 1. Single intrauterine gestational sac with yolk sac at 5 weeks 3 days by mean sac diameter, concordant with provided menstrual dating. Embryo not visualized, which could be due to early gestational age. A follow-up obstetric scan is recommended in 11-14 days. 2. No evidence of adnexal torsion. No ovarian or adnexal abnormalities. Electronically Signed   By: Delbert PhenixJason A Poff M.D.   On: 06/25/2015 18:04   [place] Art/ven Flow Abd Pelv Doppler  06/25/2015  CLINICAL  DATA:  25 year old pregnant female with sharp right pelvic pain since this morning. Pending quantitative beta HCG. EDC by LMP: 02/22/2016, projecting to an expected gestational age of [redacted] weeks 3 days. EXAM: OBSTETRIC <14 WK US AND TRANSVAGINAL OB US DOPPLER ULTRASOUND OF OVARIES TECHNIQUE: Both transabdominal and transvaginal ultrasound examinations were performed for complete evaluation of the gestation as well as the maternal uterus, adnexal regions, and pelvic cul-de-sac. Transvaginal technique was performed to assess early pregnancy. Color and duplex Doppler ultrasound was utilized to evaluate blood flow to the ovaries. COMPARISON:  No prior scans from this gestation. FINDINGS: Intrauterine gestational sac: Single intrauterine gestational sac appears normal in shape and position. Yolk sac:  Present. Embryo:  Not visualized. Embryonic Cardiac Activity: Not visualized. MSD: 6.9  mm   5 w   3  d         US EDC: 02/22/2016 Subchorionic hemorrhage:  None visualized. Maternal uterus/adnexae: Anteverted anteflexed uterus. Heterogeneity of the anterior uterine myometrium, with no definite fibroids. Right ovary measures 3.2 x 1.9 x 2.7 cm. Left ovary measures 2.5 x 1.4 x 1.6 cm. No suspicious ovarian or adnexal masses. No abnormal  free fluid in the pelvis. Pulsed Doppler evaluation of both ovaries demonstrates normal appearing low-resistance arterial and venous waveforms. IMPRESSION: 1. Single intrauterine gestational sac with yolk sac at 5 weeks 3 days by mean sac diameter, concordant with provided menstrual dating. Embryo not visualized, which could be due to early gestational age. A follow-up obstetric scan is recommended in 11-14 days. 2. No evidence of adnexal torsion. No ovarian or adnexal abnormalities. Electronically Signed   By: Delbert Phenix M.D.   On: 06/25/2015 18:04     Assessment / Plan:   Sue Sanders is a 25 y.o. G2P1001 @ 5.2 with IUP and no evidence of ectopic or appendicitis.  1. Reviewed  imaging with patient in room.  IUP identified and normal adnexal structures and normal appendix.  MRI and ultrasound show a loop of bowel in the RLQ, which could be the source of her pain.  With reassurances that this is an IUP and a normal appendix, patient is more eager to go home.  D/C home with follow up with OBGYN to establish prenatal care.     Thank you for the opportunity to be involved with this patient's care.  ----- Ranae Plumber, MD Attending Obstetrician and Gynecologist [facility]    60 minutes were spent in the context of caring for this patient, including research, discussion with providers, writing this note, and >50% face-to-face with the patient.

## 2017-10-27 IMAGING — US US OB TRANSVAGINAL
1 series · 13 of 28 positions shown · non-contrast
Comparison: No prior scans from this gestation.

CLINICAL DATA: 25-year-old pregnant female with sharp right pelvic
pain since this morning. Pending quantitative beta HCG.

EDC by LMP: 02/22/2016, projecting to an expected gestational age of
5 weeks 3 days.
EXAM:
OBSTETRIC <14 WK US AND TRANSVAGINAL OB
US DOPPLER ULTRASOUND OF OVARIES
TECHNIQUE: Both transabdominal and transvaginal ultrasound examinations were
performed for complete evaluation of the gestation as well as the
maternal uterus, adnexal regions, and pelvic cul-de-sac.
Transvaginal technique was performed to assess early pregnancy.
Color and duplex Doppler ultrasound was utilized to evaluate blood
flow to the ovaries.

[Series 1: us ob transvaginal · 0.16mm/px · 13 of 74 slices shown]
[im 3/74]
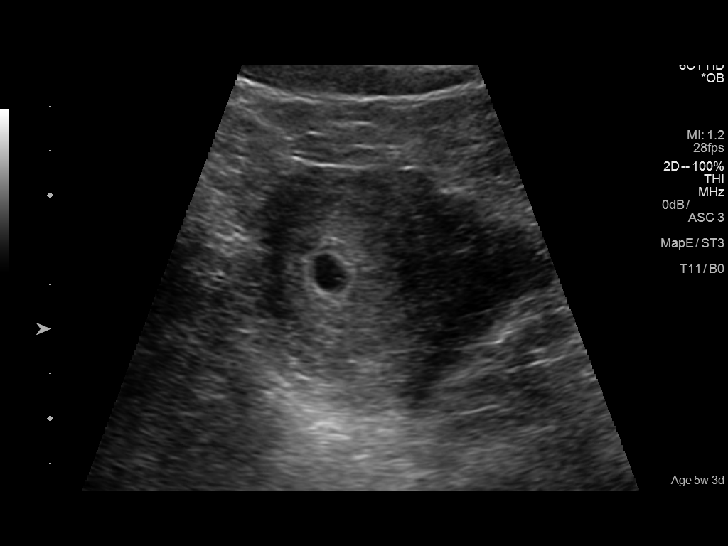
[im 9/74]
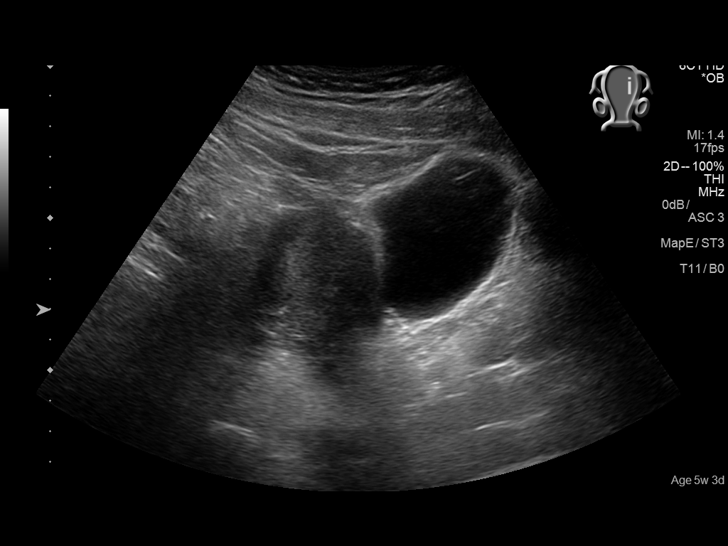
[im 14/74]
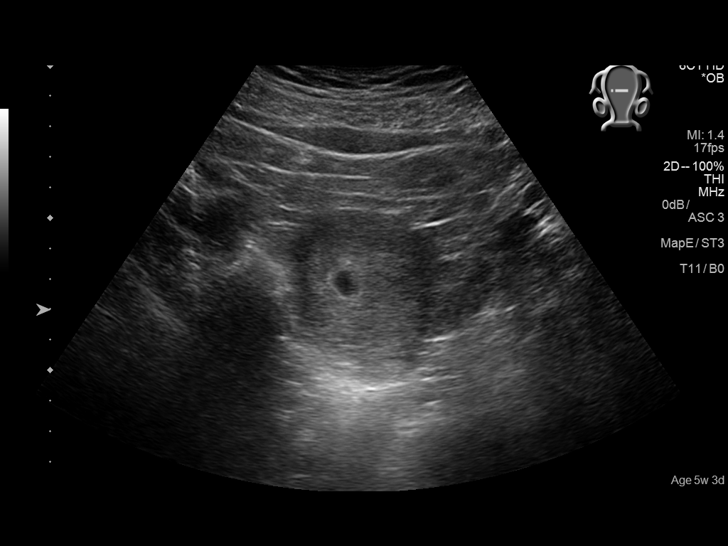
[im 19/74]
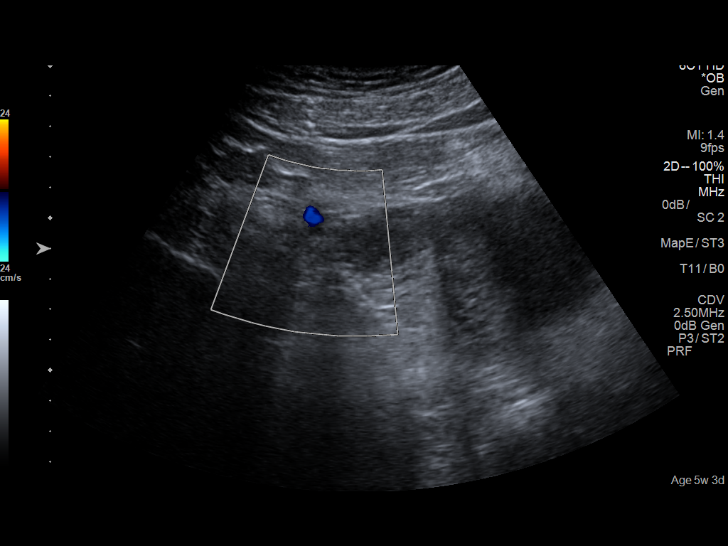
[im 25/74]
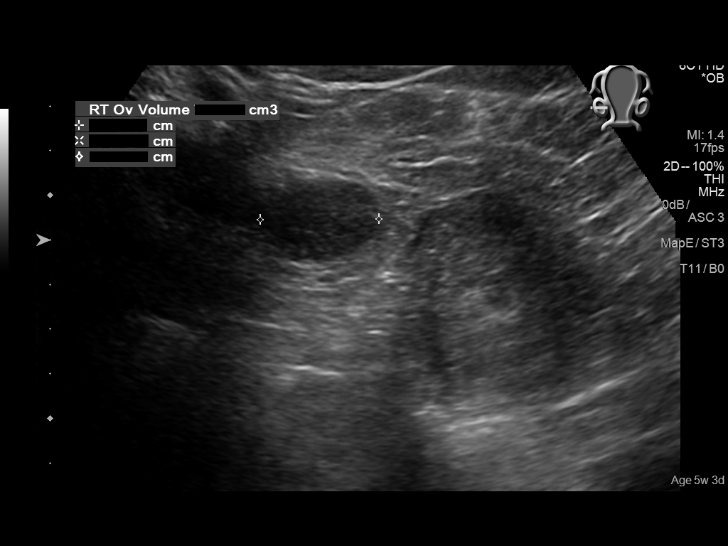
[im 30/74]
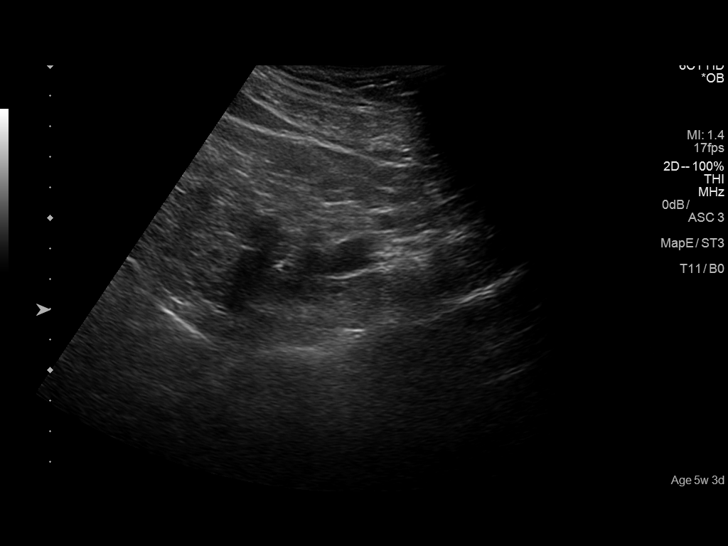
[im 38/74]
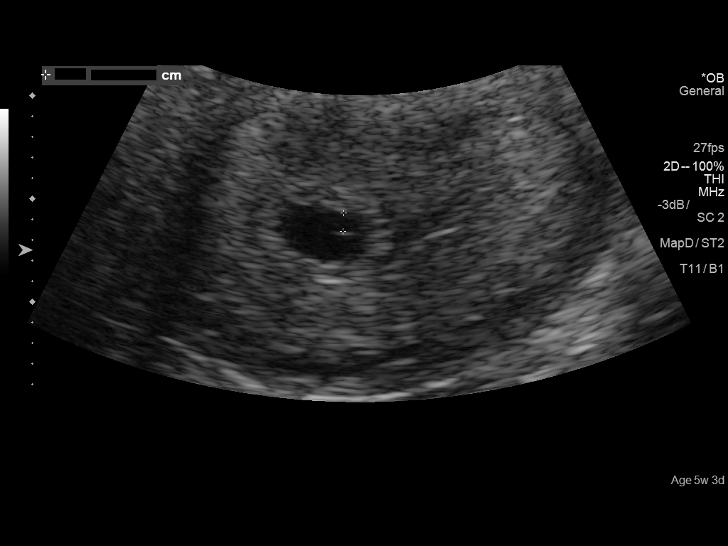
[im 44/74]
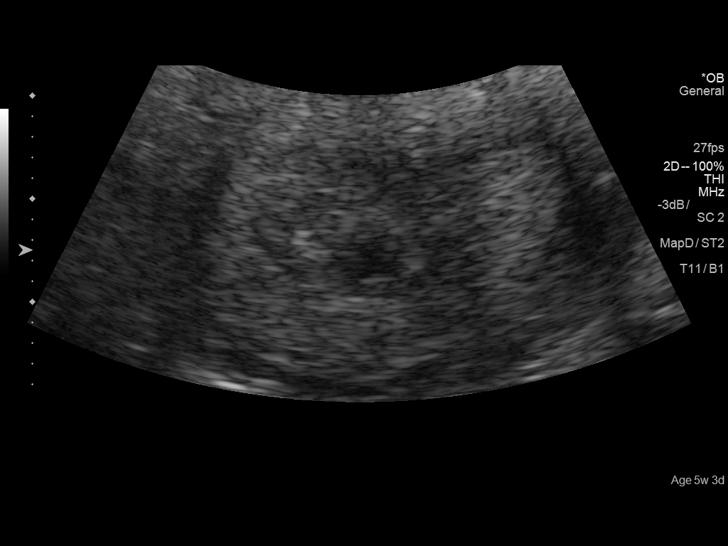
[im 49/74]
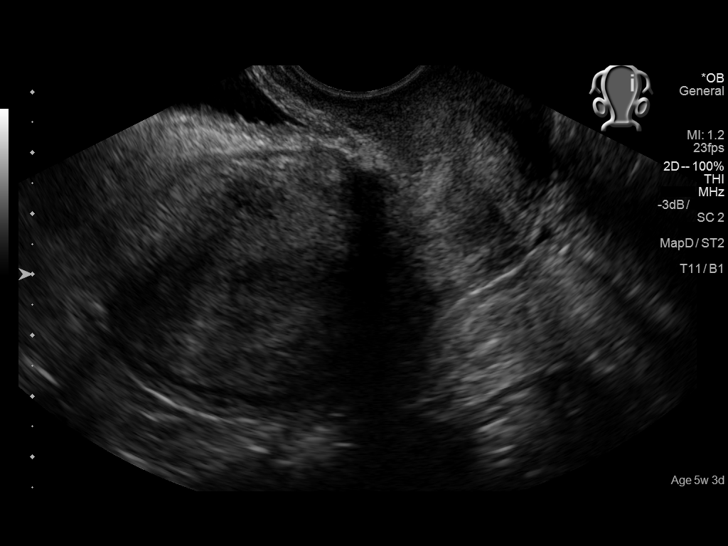
[im 55/74]
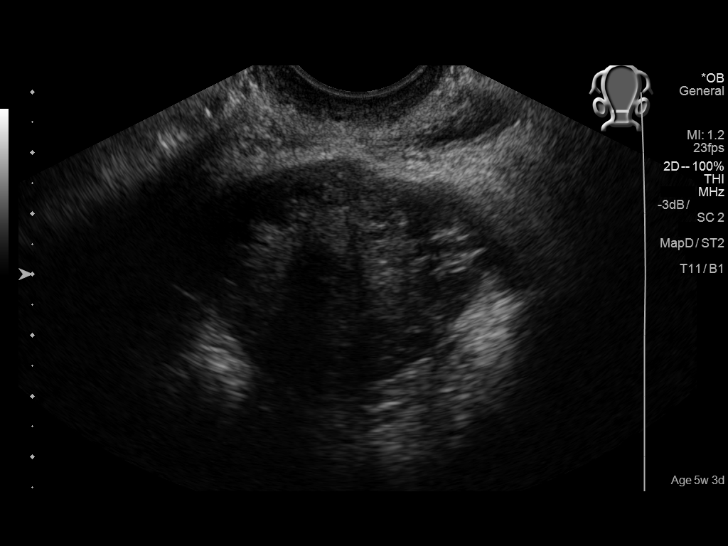
[im 60/74]
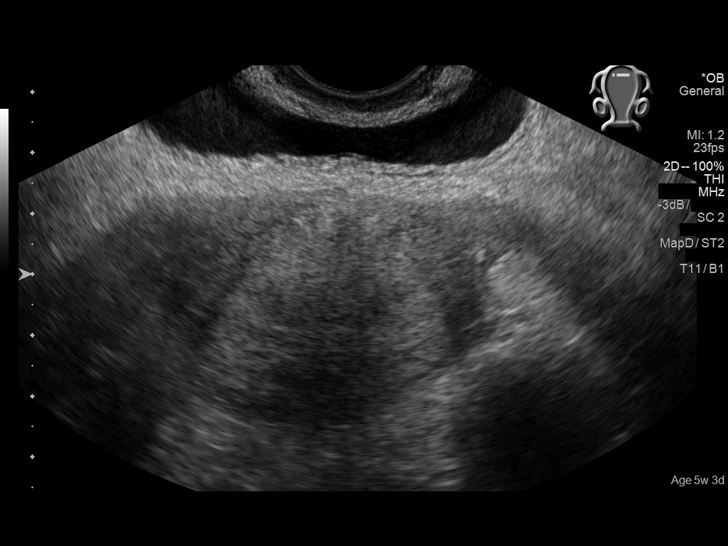
[im 65/74]
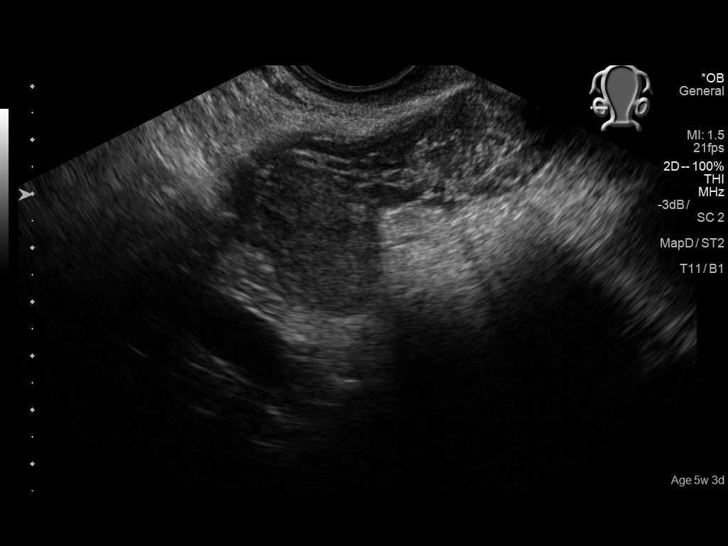
[im 71/74]
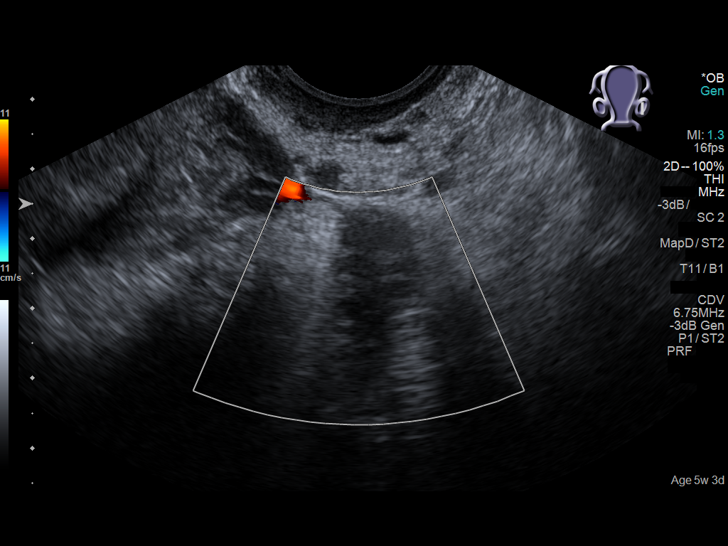

[13 of 28 positions shown; findings below may reference images not displayed]

FINDINGS: Intrauterine gestational sac: Single intrauterine gestational sac
appears normal in shape and position.

Yolk sac:  Present.

Embryo:  Not visualized.

Embryonic Cardiac Activity: Not visualized.

MSD: 6.9  mm   5 w   3  d         US EDC: 02/22/2016

Subchorionic hemorrhage:  None visualized.

Maternal uterus/adnexae: Anteverted anteflexed uterus. Heterogeneity
of the anterior uterine myometrium, with no definite fibroids.

Right ovary measures 3.2 x 1.9 x 2.7 cm. Left ovary measures 2.5 x
1.4 x 1.6 cm. No suspicious ovarian or adnexal masses. No abnormal
free fluid in the pelvis.

Pulsed Doppler evaluation of both ovaries demonstrates normal
appearing low-resistance arterial and venous waveforms.
IMPRESSION: 1. Single intrauterine gestational sac with yolk sac at 5 weeks 3
days by mean sac diameter, concordant with provided menstrual
dating. Embryo not visualized, which could be due to early
gestational age. A follow-up obstetric scan is recommended in 11-14
days.
2. No evidence of adnexal torsion. No ovarian or adnexal
abnormalities.

## 2017-11-12 IMAGING — MR MR PELVIS W/O CM
4 of 7 series · 22 of 48 positions shown · non-contrast
Comparison: 06/25/2015 obstetric sonogram.

CLINICAL DATA: 25-year-old pregnant female with sharp right
abdominopelvic pain since this morning. Intrauterine gestation with
yolk sac and no embryo visualized on sonogram from earlier today. No
ovarian or adnexal abnormalities on sonogram from earlier today.

EXAM:
MRI ABDOMEN AND PELVIS WITHOUT CONTRAST
TECHNIQUE: Multiplanar multisequence MR imaging of the abdomen and pelvis was
performed. No intravenous contrast was administered.

[Series 6: T2 · axial · 5.0mm · 0.74mm/px · z∈[-231,+171]mm · 8 of 68 slices shown (1 of 2)]
[im 1/68]
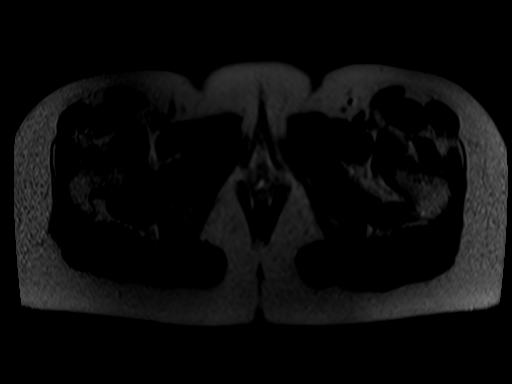
[im 10/68]
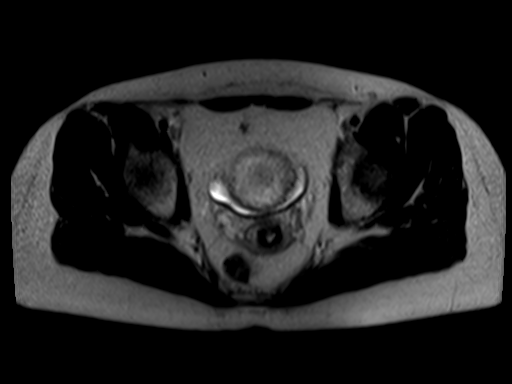
[im 20/68]
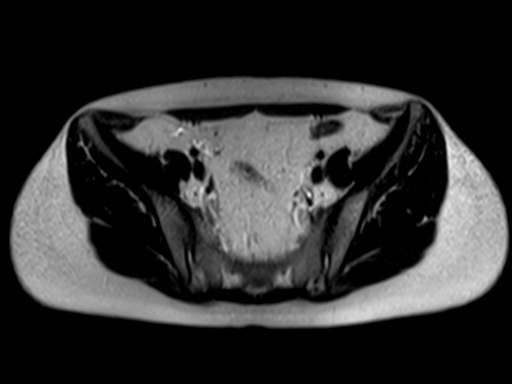
[im 29/68]
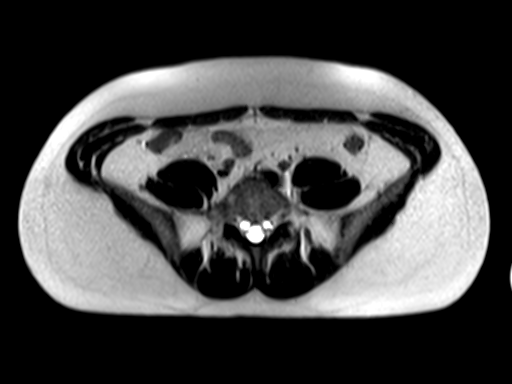
[im 39/68]
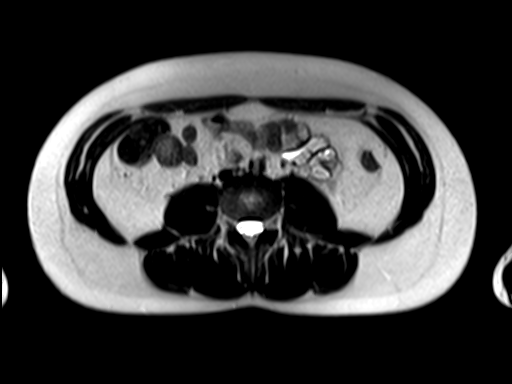
[im 48/68]
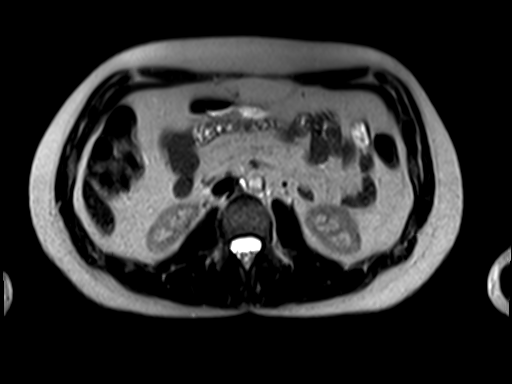
[im 58/68]
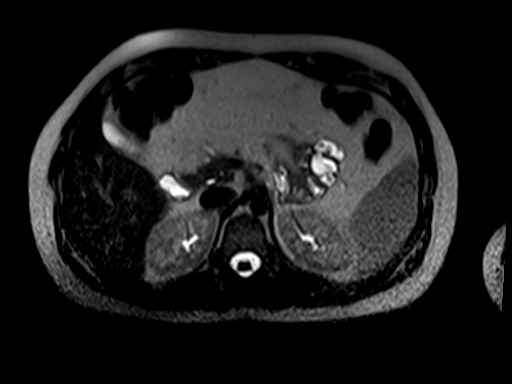
[im 68/68]
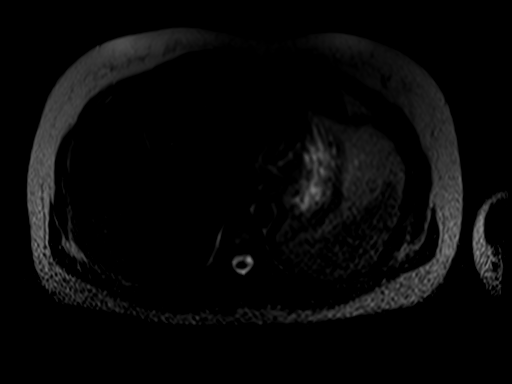

[Series 9: T2 fat-sat · axial · 5.0mm · 0.74mm/px · z∈[-231,+171]mm · 8 of 68 slices shown (1 of 2)]
[im 1/68]
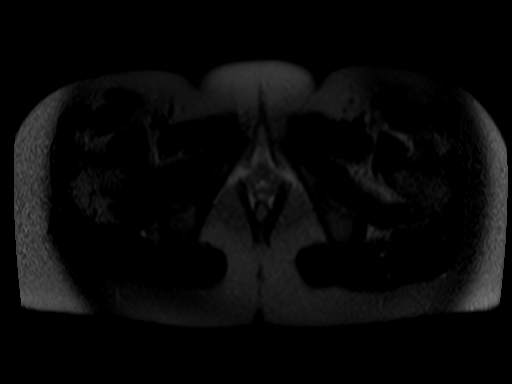
[im 10/68]
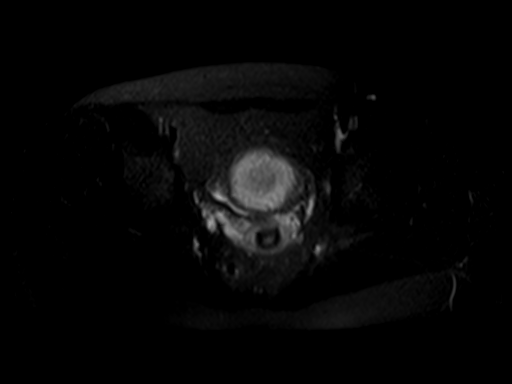
[im 20/68]
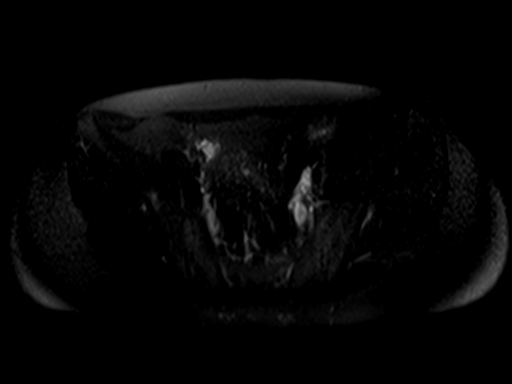
[im 29/68]
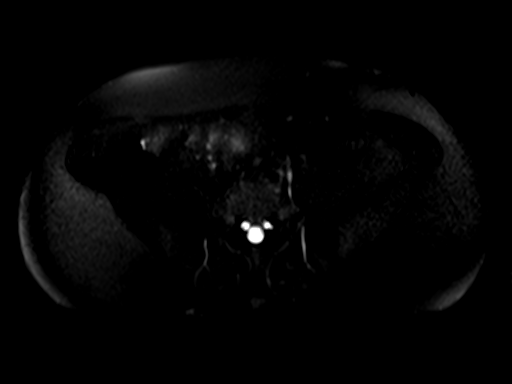
[im 39/68]
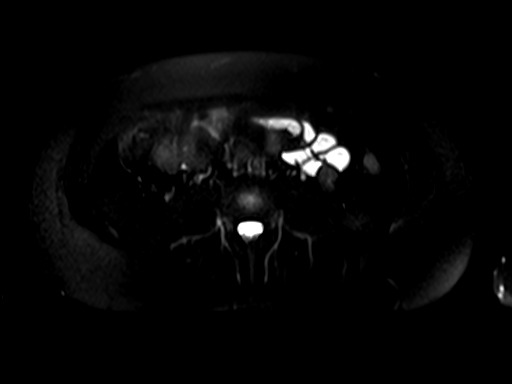
[im 48/68]
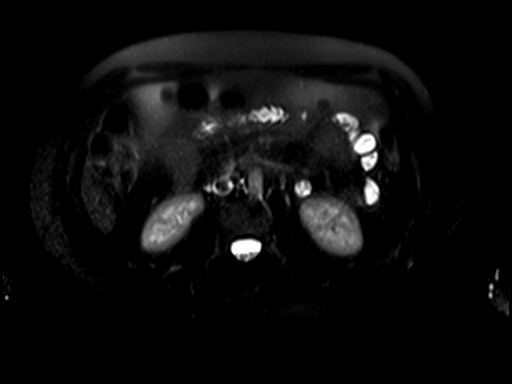
[im 58/68]
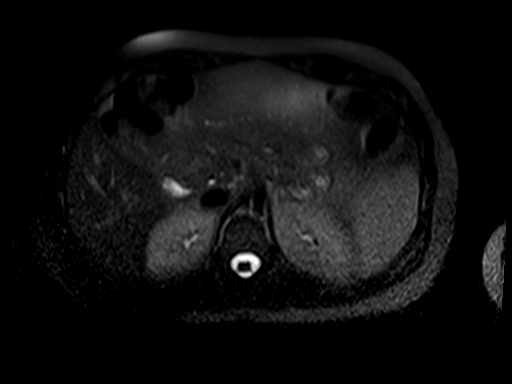
[im 68/68]
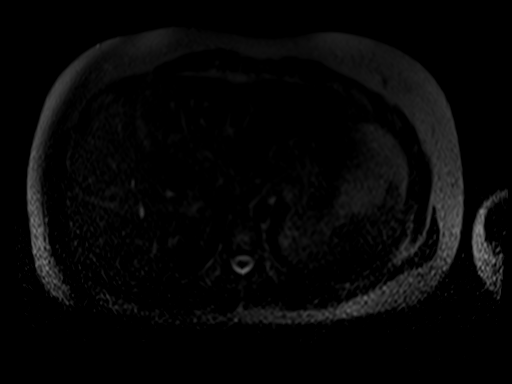

[Series 11: T2 · coronal · 5.0mm · 0.82mm/px · 3 of 32 slices shown (2 of 2)]
[im 1/32]
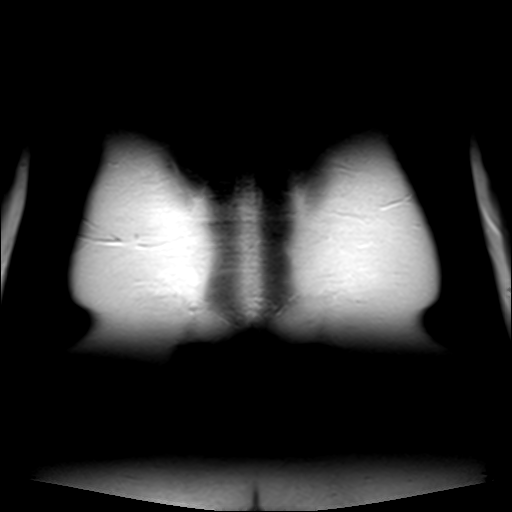
[im 21/32]
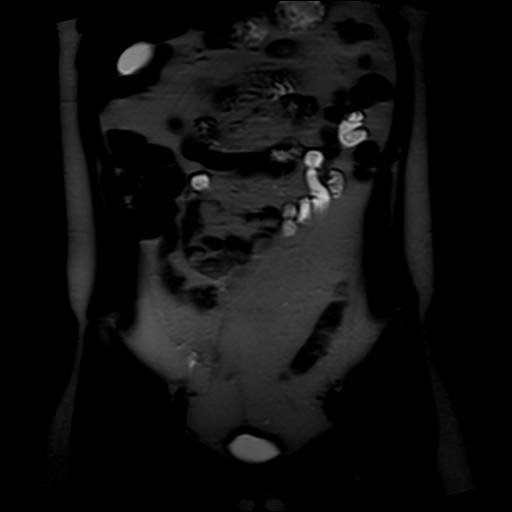
[im 32/32]
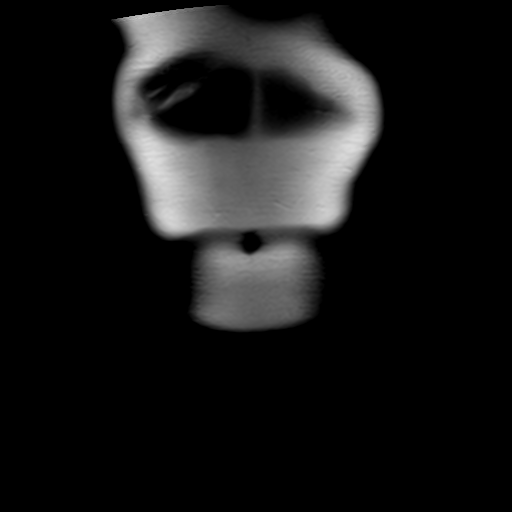

[Series 12: T2 fat-sat · coronal · 5.0mm · 0.82mm/px · 3 of 32 slices shown (2 of 2)]
[im 1/32]
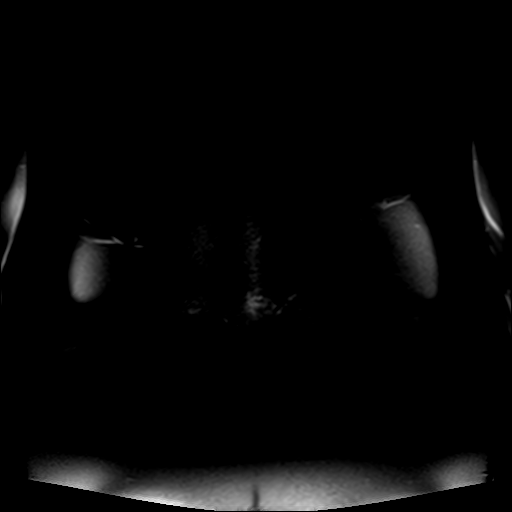
[im 16/32]
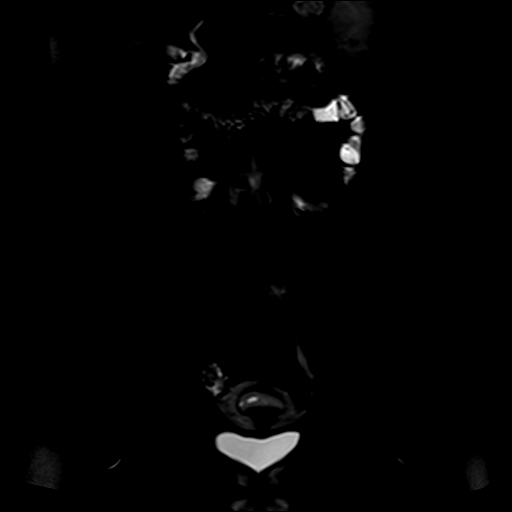
[im 32/32]
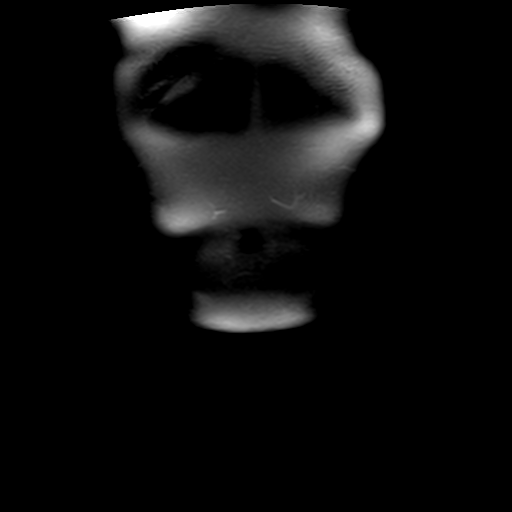

[22 of 48 positions shown; findings below may reference images not displayed]

FINDINGS: MRI ABDOMEN FINDINGS

Lower chest: Clear lung bases.

Hepatobiliary: Visualized liver is normal in size and configuration,
noting exclusion of a portion of the liver dome from the study. No
liver mass. Normal gallbladder with no cholelithiasis. No biliary
ductal dilatation. Common bile duct diameter 3 mm. No
choledocholithiasis. No evidence of a biliary or ampullary mass.

Pancreas: No pancreatic mass or duct dilation.  No pancreas divisum.

Spleen: Normal size. No mass.

Adrenals/Urinary Tract: Normal adrenals. No hydronephrosis. Normal
kidneys with no renal mass.

Stomach/Bowel: Grossly normal stomach. Small and large bowel are
normal caliber, with no bowel wall thickening. Normal appendix is
identified (series 6/image 37).

Vascular/Lymphatic: Normal caliber abdominal aorta. No
pathologically enlarged lymph nodes in the abdomen.

Other: No abdominal ascites or focal fluid collection.

Musculoskeletal: No aggressive appearing focal osseous lesions.

MRI PELVIS FINDINGS

Uterus: The anteverted uterus measures 8.2 x 4.6 x 6.5 cm. No
uterine fibroids. Intrauterine gestational sac measures 0.9 x 0.8 x
0.5 cm and appears normal in shape and position. No discrete embryo
within the gestational sac. Inner myometrium (junctional zone)
measures 4 mm in thickness, which is within normal limits.
Endometrium measures 15 mm in bilayer thickness. No evidence of a
perigestational bleed.

Ovaries and Adnexa: The right ovary measures 3.3 x 1.9 x 2.7 cm and
is normal. The left ovary measures 2.6 x 1 point x 3.0 cm and is
normal. There are no suspicious ovarian or adnexal masses.

Bladder: Normal.  Normal visualized urethra.

Bowel: Visualized small and large bowel are normal caliber with no
bowel wall thickening.

Vascular/Lymphatic: No pathologically enlarged lymph nodes in the
pelvis.

Other: No abnormal free fluid in the pelvis. No focal pelvic fluid
collection.

Musculoskeletal: No aggressive appearing focal osseous lesions.
IMPRESSION: 1. No acute abnormality in the abdomen. Normal appendix. No
cholelithiasis. No biliary ductal dilatation.
2. No acute abnormality in the pelvis. Normal ovaries. No adnexal
masses. No fluid collections.
3. Intrauterine gestational sac with no discrete embryo. No evidence
of a perigestational bleed.
